# Patient Record
Sex: Male | Born: 1970 | Hispanic: No | Marital: Single | Smoking: Never smoker
Health system: Southern US, Community
[De-identification: ages and names within clinical notes are randomized; demographics above are authoritative.]

## PROBLEM LIST (undated history)

## (undated) DIAGNOSIS — N289 Disorder of kidney and ureter, unspecified: Secondary | ICD-10-CM

## (undated) DIAGNOSIS — R569 Unspecified convulsions: Secondary | ICD-10-CM

---

## 1999-04-01 ENCOUNTER — Ambulatory Visit (HOSPITAL_COMMUNITY): Admission: RE | Admit: 1999-04-01 | Discharge: 1999-04-01 | Payer: Self-pay | Admitting: Neurology

## 1999-04-16 ENCOUNTER — Ambulatory Visit (HOSPITAL_COMMUNITY): Admission: RE | Admit: 1999-04-16 | Discharge: 1999-04-16 | Payer: Self-pay | Admitting: Neurology

## 1999-05-21 ENCOUNTER — Ambulatory Visit (HOSPITAL_COMMUNITY): Admission: RE | Admit: 1999-05-21 | Discharge: 1999-05-21 | Payer: Self-pay | Admitting: Neurosurgery

## 1999-05-21 ENCOUNTER — Encounter: Payer: Self-pay | Admitting: Neurosurgery

## 1999-05-25 ENCOUNTER — Inpatient Hospital Stay (HOSPITAL_COMMUNITY): Admission: RE | Admit: 1999-05-25 | Discharge: 1999-05-27 | Payer: Self-pay | Admitting: Neurosurgery

## 2001-03-12 ENCOUNTER — Ambulatory Visit (HOSPITAL_COMMUNITY): Admission: RE | Admit: 2001-03-12 | Discharge: 2001-03-12 | Payer: Self-pay | Admitting: Neurology

## 2001-03-13 ENCOUNTER — Encounter: Payer: Self-pay | Admitting: Neurology

## 2002-03-07 ENCOUNTER — Emergency Department (HOSPITAL_COMMUNITY): Admission: EM | Admit: 2002-03-07 | Discharge: 2002-03-07 | Payer: Self-pay | Admitting: *Deleted

## 2002-03-14 ENCOUNTER — Encounter: Payer: Self-pay | Admitting: Orthopedic Surgery

## 2002-03-14 ENCOUNTER — Inpatient Hospital Stay (HOSPITAL_COMMUNITY): Admission: RE | Admit: 2002-03-14 | Discharge: 2002-03-16 | Payer: Self-pay | Admitting: Orthopedic Surgery

## 2010-10-17 ENCOUNTER — Encounter: Payer: Self-pay | Admitting: Family Medicine

## 2014-09-24 ENCOUNTER — Emergency Department (HOSPITAL_COMMUNITY)
Admission: EM | Admit: 2014-09-24 | Discharge: 2014-09-24 | Disposition: A | Discharge status: Home / Self Care | Payer: BC Managed Care – PPO | Attending: Emergency Medicine | Admitting: Emergency Medicine

## 2014-09-24 ENCOUNTER — Emergency Department (HOSPITAL_COMMUNITY): Payer: BC Managed Care – PPO

## 2014-09-24 ENCOUNTER — Encounter (HOSPITAL_COMMUNITY): Payer: Self-pay

## 2014-09-24 DIAGNOSIS — G40909 Epilepsy, unspecified, not intractable, without status epilepticus: Secondary | ICD-10-CM | POA: Insufficient documentation

## 2014-09-24 DIAGNOSIS — R109 Unspecified abdominal pain: Secondary | ICD-10-CM | POA: Diagnosis present

## 2014-09-24 DIAGNOSIS — Z79899 Other long term (current) drug therapy: Secondary | ICD-10-CM | POA: Diagnosis not present

## 2014-09-24 DIAGNOSIS — N201 Calculus of ureter: Secondary | ICD-10-CM | POA: Insufficient documentation

## 2014-09-24 HISTORY — DX: Unspecified convulsions: R56.9

## 2014-09-24 HISTORY — DX: Disorder of kidney and ureter, unspecified: N28.9

## 2014-09-24 LAB — CBC WITH DIFFERENTIAL/PLATELET
BASOS PCT: 0 % (ref 0–1)
Basophils Absolute: 0 10*3/uL (ref 0.0–0.1)
EOS PCT: 1 % (ref 0–5)
Eosinophils Absolute: 0.1 10*3/uL (ref 0.0–0.7)
HCT: 38.5 % — ABNORMAL LOW (ref 39.0–52.0)
HEMOGLOBIN: 13.1 g/dL (ref 13.0–17.0)
LYMPHS ABS: 1.2 10*3/uL (ref 0.7–4.0)
LYMPHS PCT: 13 % (ref 12–46)
MCH: 31.4 pg (ref 26.0–34.0)
MCHC: 34 g/dL (ref 30.0–36.0)
MCV: 92.3 fL (ref 78.0–100.0)
Monocytes Absolute: 0.7 10*3/uL (ref 0.1–1.0)
Monocytes Relative: 8 % (ref 3–12)
NEUTROS ABS: 7.3 10*3/uL (ref 1.7–7.7)
NEUTROS PCT: 78 % — AB (ref 43–77)
Platelets: 273 10*3/uL (ref 150–400)
RBC: 4.17 MIL/uL — AB (ref 4.22–5.81)
RDW: 11.8 % (ref 11.5–15.5)
WBC: 9.4 10*3/uL (ref 4.0–10.5)

## 2014-09-24 LAB — BASIC METABOLIC PANEL
Anion gap: 7 (ref 5–15)
BUN: 16 mg/dL (ref 6–23)
CHLORIDE: 102 meq/L (ref 96–112)
CO2: 26 mmol/L (ref 19–32)
Calcium: 9.1 mg/dL (ref 8.4–10.5)
Creatinine, Ser: 1.58 mg/dL — ABNORMAL HIGH (ref 0.50–1.35)
GFR calc Af Amer: 61 mL/min — ABNORMAL LOW (ref >90)
GFR calc non Af Amer: 52 mL/min — ABNORMAL LOW (ref >90)
Glucose, Bld: 104 mg/dL — ABNORMAL HIGH (ref 70–99)
Potassium: 4 mmol/L (ref 3.5–5.1)
Sodium: 135 mmol/L (ref 135–145)

## 2014-09-24 LAB — URINALYSIS, ROUTINE W REFLEX MICROSCOPIC
Bilirubin Urine: NEGATIVE
Glucose, UA: NEGATIVE mg/dL
Hgb urine dipstick: NEGATIVE
KETONES UR: NEGATIVE mg/dL
Leukocytes, UA: NEGATIVE
NITRITE: NEGATIVE
PH: 5.5 (ref 5.0–8.0)
Protein, ur: 30 mg/dL — AB
Specific Gravity, Urine: 1.024 (ref 1.005–1.030)
Urobilinogen, UA: 0.2 mg/dL (ref 0.0–1.0)

## 2014-09-24 LAB — URINE MICROSCOPIC-ADD ON

## 2014-09-24 MED ORDER — OXYCODONE-ACETAMINOPHEN 5-325 MG PO TABS: 1.0000 | ORAL_TABLET | ORAL | Status: AC | PRN

## 2014-09-24 MED ORDER — MORPHINE SULFATE 4 MG/ML IJ SOLN
4.0000 mg | Freq: Once | INTRAMUSCULAR | Status: AC
  Administered 2014-09-24: 4 mg via INTRAVENOUS
  Filled 2014-09-24: qty 1

## 2014-09-24 MED ORDER — ONDANSETRON HCL 4 MG/2ML IJ SOLN
4.0000 mg | Freq: Once | INTRAMUSCULAR | Status: AC
  Administered 2014-09-24: 4 mg via INTRAVENOUS
  Filled 2014-09-24: qty 2

## 2014-09-24 MED ORDER — SODIUM CHLORIDE 0.9 % IV BOLUS (SEPSIS)
1000.0000 mL | Freq: Once | INTRAVENOUS | Status: AC
  Administered 2014-09-24: 1000 mL via INTRAVENOUS

## 2014-09-24 NOTE — ED Provider Notes (Signed)
CSN: [id number]     Arrival date & time 09/24/14  [id number] History   First MD Initiated Contact with Patient 09/24/14 [id number]     Chief Complaint  Patient presents with  . Flank Pain     (Consider location/radiation/quality/duration/timing/severity/associated sxs/prior Treatment) HPI  43 year old male with right sided back pain for past 8 days. Saw PCP, given flomax, oxycodone and phenergan for presumptive kidney stone. Started high on right side, now is more low back and somewhat in groin. Feeling better a few days ago, yesterday required minimal pain meds. Pain is getting worse again. Initially had difficulty urinating, better with flomax. Feels similar to kidney stone from 10 years ago. Originally had hematuria, but none now. Vomited this morning, now feels nauseated.  Past Medical History  Diagnosis Date  . Renal disorder   . Seizures    History reviewed. No pertinent past surgical history. History reviewed. No pertinent family history. History  Substance Use Topics  . Smoking status: Never Smoker   . Smokeless tobacco: Not on file  . Alcohol Use: Yes    Review of Systems  Constitutional: Negative for fever.  Gastrointestinal: Positive for nausea and vomiting. Negative for abdominal pain.  Genitourinary: Positive for flank pain. Negative for dysuria, hematuria and testicular pain.  Musculoskeletal: Positive for back pain.  All other systems reviewed and are negative.     Allergies  Review of patient's allergies indicates no known allergies.  Home Medications   Prior to Admission medications   Medication Sig Start Date End Date Taking? Authorizing Provider  levETIRAcetam (KEPPRA) 1000 MG tablet Take 1,000 mg by mouth 2 (two) times daily.   Yes Historical Provider, MD  Multiple Vitamin (MULTIVITAMIN WITH MINERALS) TABS tablet Take 1 tablet by mouth daily.   Yes Historical Provider, MD  oxyCODONE-acetaminophen (PERCOCET/ROXICET) 5-325 MG per tablet Take 1 tablet by mouth  every 6 (six) hours as needed for severe pain.   Yes Historical Provider, MD  PRESCRIPTION MEDICATION once. Nausea and pain shot (unknown names) at Dr's office   Yes Historical Provider, MD  promethazine (PHENERGAN) 25 MG tablet Take 25 mg by mouth 3 (three) times daily as needed for nausea or vomiting.   Yes Historical Provider, MD  tamsulosin (FLOMAX) 0.4 MG CAPS capsule Take 0.4 mg by mouth daily.   Yes Historical Provider, MD   BP 176/91 mmHg  Pulse 81  Temp(Src) 97.6 F (36.4 C) (Oral)  Resp 20  Ht 5\' 11"  (1.803 m)  Wt 275 lb (124.739 kg)  BMI 38.37 kg/m2  SpO2 97% Physical Exam  Constitutional: He is oriented to person, place, and time. He appears well-developed and well-nourished.  Sitting comfortably, displays pain when moving positions  HENT:  Head: Normocephalic and atraumatic.  Right Ear: External ear normal.  Left Ear: External ear normal.  Nose: Nose normal.  Eyes: Right eye exhibits no discharge. Left eye exhibits no discharge.  Neck: Neck supple.  Cardiovascular: Normal rate, regular rhythm, normal heart sounds and intact distal pulses.   Pulmonary/Chest: Effort normal and breath sounds normal.  Abdominal: Soft. He exhibits no distension. There is no tenderness. There is no CVA tenderness.  Musculoskeletal: He exhibits no edema.  No focal thoracic or lumbar back tenderness  Neurological: He is alert and oriented to person, place, and time.  Skin: Skin is warm and dry.  Nursing note and vitals reviewed.   ED Course  Procedures (including critical care time) Labs Review Labs Reviewed  URINALYSIS, ROUTINE W REFLEX MICROSCOPIC -  Abnormal; Notable for the following:    Protein, ur 30 (*)    All other components within normal limits  BASIC METABOLIC PANEL - Abnormal; Notable for the following:    Glucose, Bld 104 (*)    Creatinine, Ser 1.58 (*)    GFR calc non Af Amer 52 (*)    GFR calc Af Amer 61 (*)    All other components within normal limits  CBC WITH  DIFFERENTIAL - Abnormal; Notable for the following:    RBC 4.17 (*)    HCT 38.5 (*)    Neutrophils Relative % 78 (*)    All other components within normal limits  URINE MICROSCOPIC-ADD ON    Imaging Review Ct Renal Stone Study  09/24/2014   CLINICAL DATA:  One week history of right flank pain ; microscopic hematuria  EXAM: CT ABDOMEN AND PELVIS WITHOUT CONTRAST  TECHNIQUE: Multidetector CT imaging of the abdomen and pelvis was performed following the standard protocol without oral or intravenous contrast material administration.  COMPARISON:  May 13, 2004  FINDINGS: There is mild atelectasis in the posterior right base. Lung bases are otherwise clear.  Liver is prominent, measuring 19.8 cm in length. No focal liver lesions are identified on this noncontrast enhanced study. Gallbladder wall is not appreciably thickened. There is no biliary duct dilatation.  Spleen, pancreas, and adrenals appear normal.  There is a 3 mm nonobstructing calculus in the posterior mid left kidney. There is no left renal hydronephrosis or mass. There is no left-sided ureteral calculus.  Right kidney is mildly edematous with moderate hydronephrosis. There is no right renal mass. There is a 4 mm calculus in the distal right ureter. No other ureteral calculi are identified.  In the pelvis, the urinary bladder is midline with normal wall thickness. There are calcifications in the right side of the prostate. There is no pelvic mass or fluid collection. Appendix appears normal.  There is no bowel obstruction.  No free air or portal venous air.  There is no ascites, adenopathy, or abscess in the abdomen or pelvis. There is no abdominal aortic aneurysm. There is degenerative change in the lumbar spine. There are no blastic or lytic bone lesions.  IMPRESSION: 4 mm calculus distal right ureter with moderate hydronephrosis on the right.  3 mm nonobstructing calculus posterior mid left kidney.  Prominent liver without focal lesion.   Appendix appears normal.  No bowel obstruction.  No abscess.   Electronically Signed   By: Bretta BangWilliam  Woodruff M.D.   On: 09/24/2014 08:25     EKG Interpretation None      MDM   Final diagnoses:  Right ureteral stone    Patient has a distal right 4 mm ureteral calculus. His symptoms seem to be progressing and I do not feel that he has failed outpatient therapy at this time. He is already on Flomax and his been trying to keep up fluids. He is running out of pain medicine, will give short course of Percocet and give urology follow-up.    Audree CamelScott T Happy [place], MD 09/24/14 [phone]

## 2014-09-24 NOTE — ED Notes (Signed)
Pt complains of right flank pain for one week, pain got worse last night

## 2014-09-24 NOTE — ED Notes (Signed)
Patient transported to CT 

## 2014-09-24 NOTE — Discharge Instructions (Signed)

## 2015-12-20 IMAGING — CT CT RENAL STONE PROTOCOL
1 series · 14 of 21 positions shown, 19 images · non-contrast
Comparison: May 13, 2004

CLINICAL DATA: One week history of right flank pain ; microscopic
hematuria

EXAM:
CT ABDOMEN AND PELVIS WITHOUT CONTRAST
TECHNIQUE: Multidetector CT imaging of the abdomen and pelvis was performed
following the standard protocol without oral or intravenous contrast
material administration.

[Series 4: lung · axial · 0.79mm/px · z∈[+1676,+1766]mm · 14 of 21 slices shown, 19 images]
[im 2/21  soft-tissue]
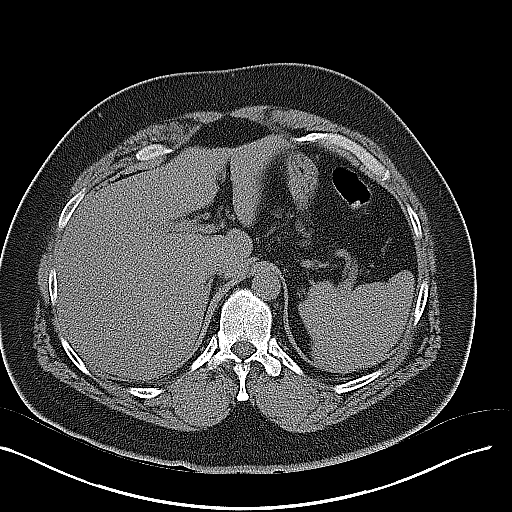
[im 2/21  bone]
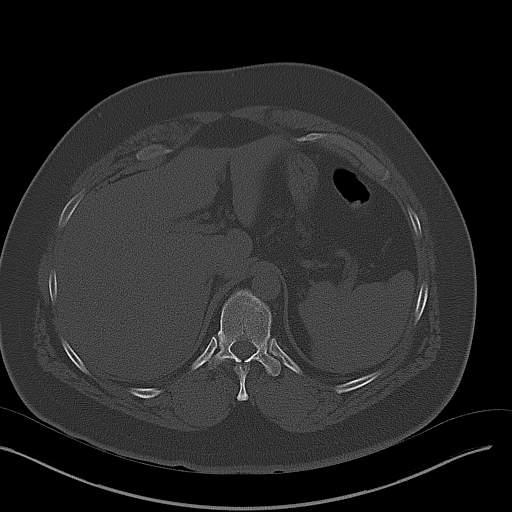
[im 4/21  soft-tissue]
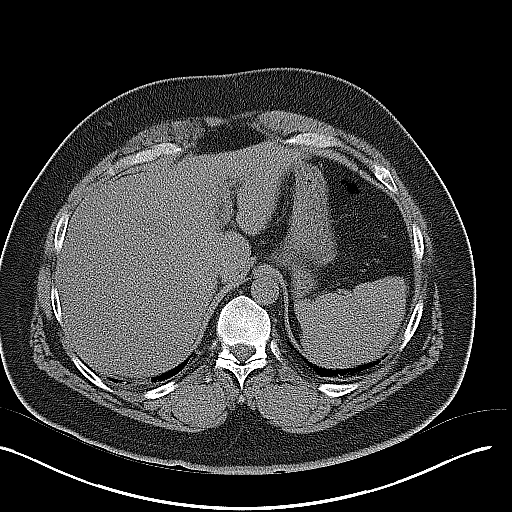
[im 5/21  soft-tissue]
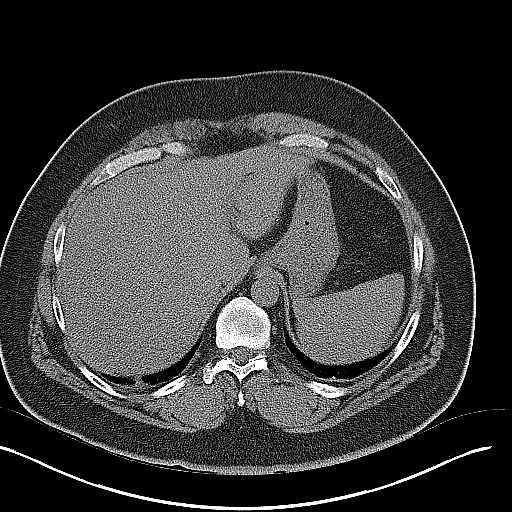
[im 7/21  soft-tissue]
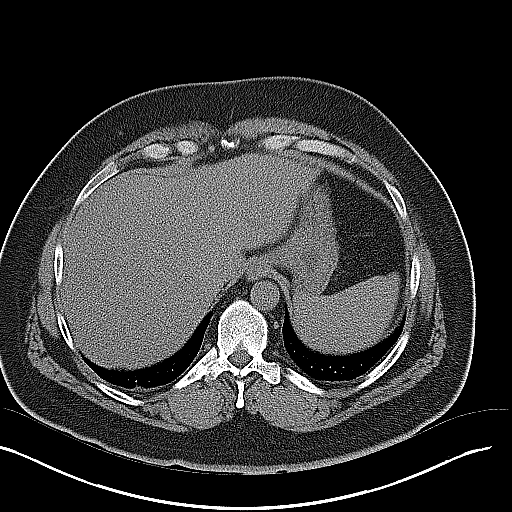
[im 8/21  soft-tissue]
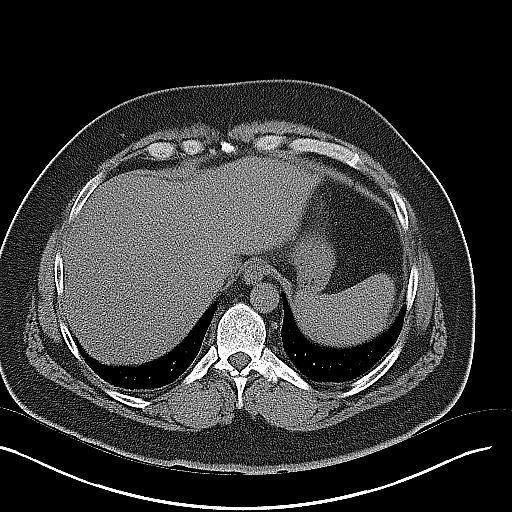
[im 10/21  soft-tissue]
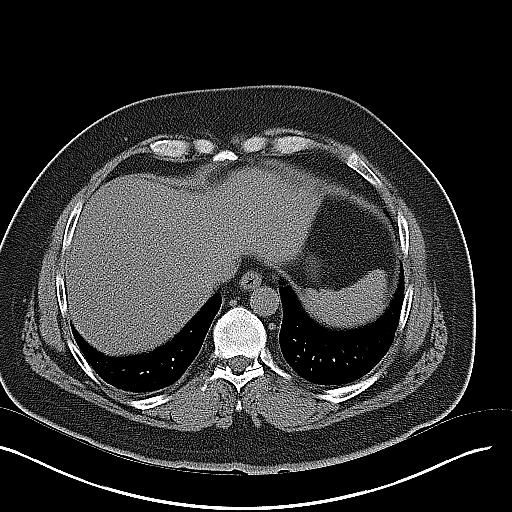
[im 11/21  soft-tissue]
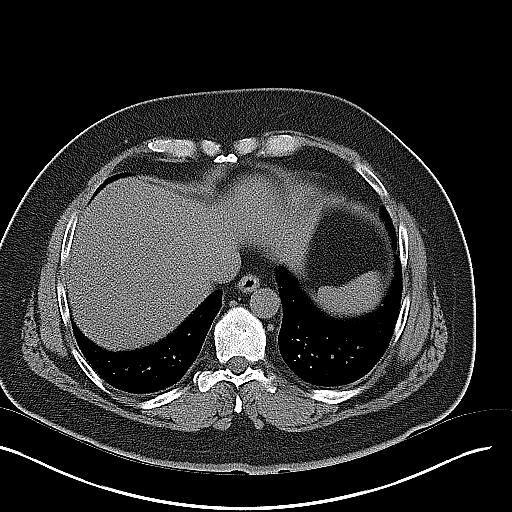
[im 12/21  soft-tissue]
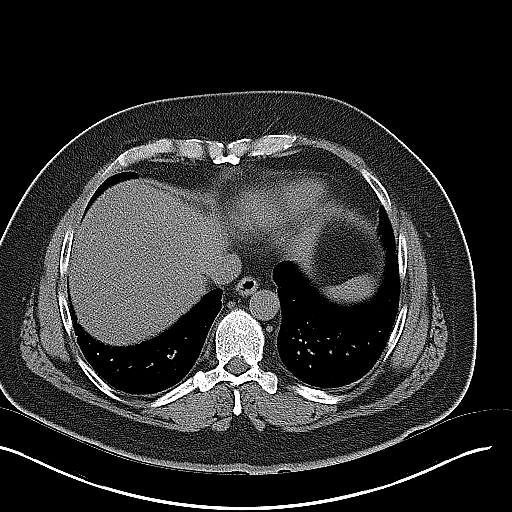
[im 14/21  soft-tissue]
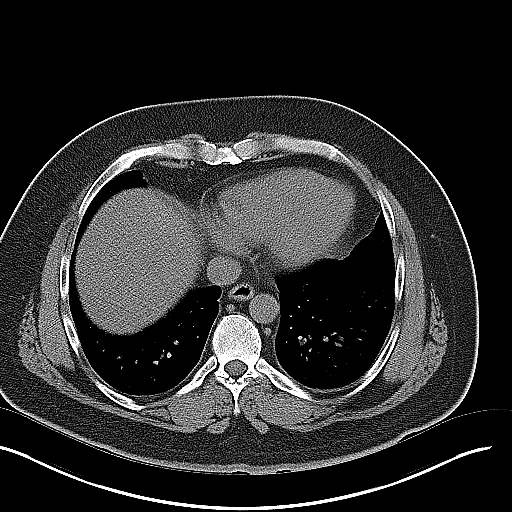
[im 14/21  bone]
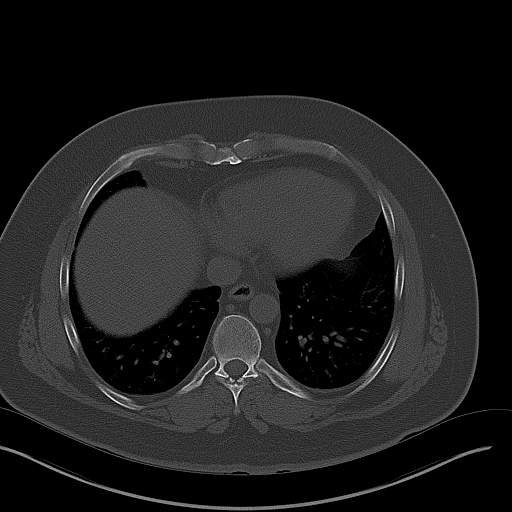
[im 15/21  soft-tissue]
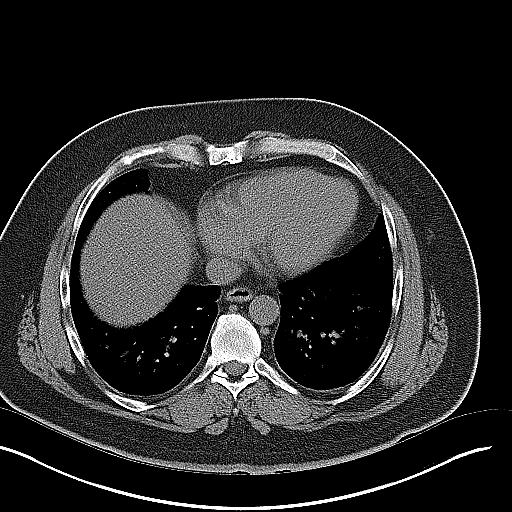
[im 17/21  soft-tissue]
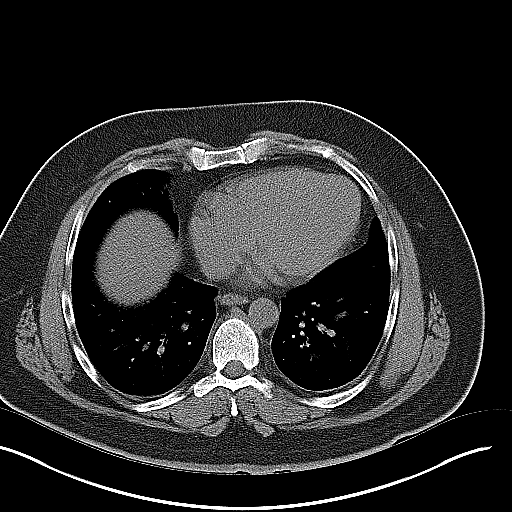
[im 17/21  lung]
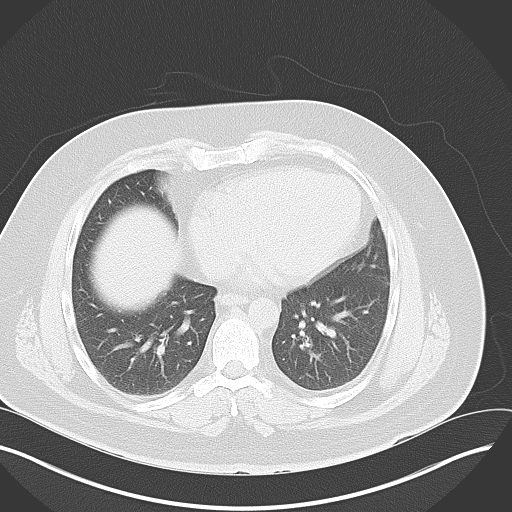
[im 18/21  soft-tissue]
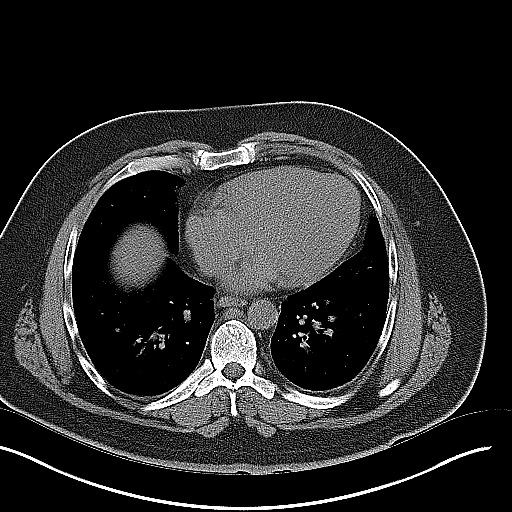
[im 18/21  lung]
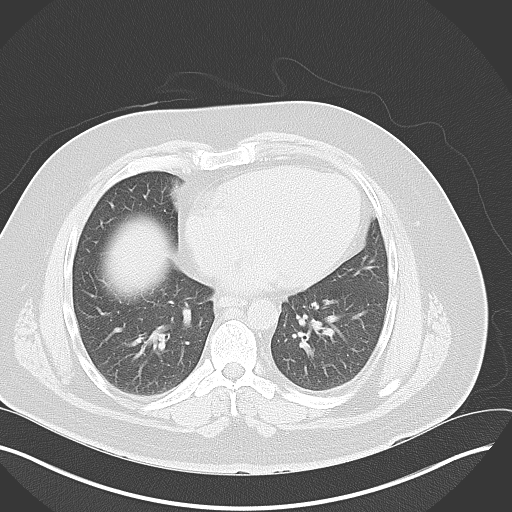
[im 19/21  lung]
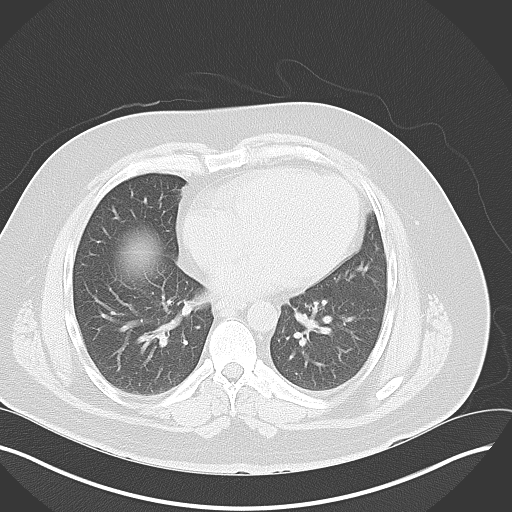
[im 20/21  soft-tissue]
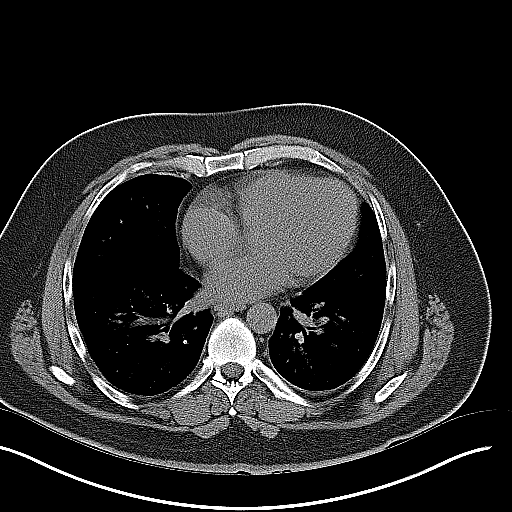
[im 20/21  lung]
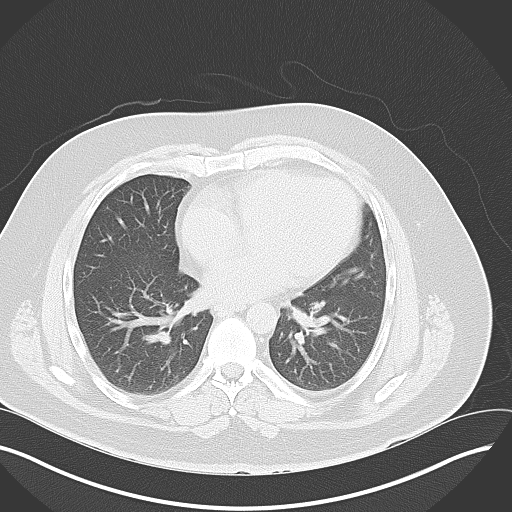

[14 of 21 positions shown; findings below may reference images not displayed]

FINDINGS: There is mild atelectasis in the posterior right base. Lung bases
are otherwise clear.

Liver is prominent, measuring 19.8 cm in length. No focal liver
lesions are identified on this noncontrast enhanced study.
Gallbladder wall is not appreciably thickened. There is no biliary
duct dilatation.

Spleen, pancreas, and adrenals appear normal.

There is a 3 mm nonobstructing calculus in the posterior mid left
kidney. There is no left renal hydronephrosis or mass. There is no
left-sided ureteral calculus.

Right kidney is mildly edematous with moderate hydronephrosis. There
is no right renal mass. There is a 4 mm calculus in the distal right
ureter. No other ureteral calculi are identified.

In the pelvis, the urinary bladder is midline with normal wall
thickness. There are calcifications in the right side of the
prostate. There is no pelvic mass or fluid collection. Appendix
appears normal.

There is no bowel obstruction.  No free air or portal venous air.

There is no ascites, adenopathy, or abscess in the abdomen or
pelvis. There is no abdominal aortic aneurysm. There is degenerative
change in the lumbar spine. There are no blastic or lytic bone
lesions.
IMPRESSION: 4 mm calculus distal right ureter with moderate hydronephrosis on
the right.

3 mm nonobstructing calculus posterior mid left kidney.

Prominent liver without focal lesion.

Appendix appears normal.  No bowel obstruction.  No abscess.

## 2017-05-17 DIAGNOSIS — S90862S Insect bite (nonvenomous), left foot, sequela: Secondary | ICD-10-CM | POA: Diagnosis not present

## 2017-07-03 DIAGNOSIS — R7303 Prediabetes: Secondary | ICD-10-CM | POA: Diagnosis not present

## 2017-07-03 DIAGNOSIS — R569 Unspecified convulsions: Secondary | ICD-10-CM | POA: Diagnosis not present

## 2017-07-03 DIAGNOSIS — Z0000 Encounter for general adult medical examination without abnormal findings: Secondary | ICD-10-CM | POA: Diagnosis not present

## 2018-01-01 DIAGNOSIS — R7303 Prediabetes: Secondary | ICD-10-CM | POA: Diagnosis not present

## 2018-07-10 DIAGNOSIS — Z0000 Encounter for general adult medical examination without abnormal findings: Secondary | ICD-10-CM | POA: Diagnosis not present

## 2018-07-10 DIAGNOSIS — R7301 Impaired fasting glucose: Secondary | ICD-10-CM | POA: Diagnosis not present

## 2018-07-10 DIAGNOSIS — Z136 Encounter for screening for cardiovascular disorders: Secondary | ICD-10-CM | POA: Diagnosis not present

## 2018-08-27 DIAGNOSIS — H53001 Unspecified amblyopia, right eye: Secondary | ICD-10-CM | POA: Diagnosis not present

## 2018-08-27 DIAGNOSIS — H10413 Chronic giant papillary conjunctivitis, bilateral: Secondary | ICD-10-CM | POA: Diagnosis not present

## 2018-08-27 DIAGNOSIS — H04123 Dry eye syndrome of bilateral lacrimal glands: Secondary | ICD-10-CM | POA: Diagnosis not present

## 2019-01-16 DIAGNOSIS — R7301 Impaired fasting glucose: Secondary | ICD-10-CM | POA: Diagnosis not present

## 2019-11-18 ENCOUNTER — Ambulatory Visit: Payer: Self-pay | Attending: Family

## 2019-11-18 DIAGNOSIS — Z23 Encounter for immunization: Secondary | ICD-10-CM | POA: Insufficient documentation

## 2019-11-18 NOTE — Progress Notes (Signed)
   Covid-19 Vaccination [facility]  Name:  Leighton Brickley    MRN: [id number] DOB: 06-29-1971  11/18/2019  Mr. Lampley was observed post Covid-19 immunization for 15 minutes without incidence. He was provided with Vaccine Information Sheet and instruction to access the V-Safe system.   Mr. Norem was instructed to call 911 with any severe reactions post vaccine: Marland Kitchen Difficulty breathing  . Swelling of your face and throat  . A fast heartbeat  . A bad rash all over your body  . Dizziness and weakness    Immunizations Administered    Name Date Dose VIS Date Route   Moderna COVID-19 Vaccine 11/18/2019  4:19 PM 0.5 mL 08/27/2019 Intramuscular   Manufacturer: Moderna   Lot: [id number]   NDC: 01720-910-68

## 2019-12-24 ENCOUNTER — Ambulatory Visit: Payer: Self-pay

## 2019-12-31 ENCOUNTER — Ambulatory Visit: Payer: Self-pay | Attending: Family

## 2019-12-31 ENCOUNTER — Ambulatory Visit: Payer: Self-pay

## 2019-12-31 DIAGNOSIS — Z23 Encounter for immunization: Secondary | ICD-10-CM

## 2019-12-31 NOTE — Progress Notes (Signed)
   Covid-19 Vaccination Clinic  Name:  Zachary Pena    MRN: [id number] DOB: 1970/12/27  12/31/2019  Mr. Zachary Pena was observed post Covid-19 immunization for 15 minutes without incident. He was provided with Vaccine Information Sheet and instruction to access the V-Safe system.   Mr. Zachary Pena was instructed to call 911 with any severe reactions post vaccine: Marland Kitchen Difficulty breathing  . Swelling of face and throat  . A fast heartbeat  . A bad rash all over body  . Dizziness and weakness   Immunizations Administered    Name Date Dose VIS Date Route   Moderna COVID-19 Vaccine 12/31/2019  4:14 PM 0.5 mL 08/27/2019 Intramuscular   Manufacturer: Moderna   Lot: [id number]   NDC: 36629-476-54

## 2022-08-22 DIAGNOSIS — R569 Unspecified convulsions: Secondary | ICD-10-CM | POA: Diagnosis not present

## 2022-08-22 DIAGNOSIS — Z113 Encounter for screening for infections with a predominantly sexual mode of transmission: Secondary | ICD-10-CM | POA: Diagnosis not present

## 2022-08-22 DIAGNOSIS — Z13220 Encounter for screening for lipoid disorders: Secondary | ICD-10-CM | POA: Diagnosis not present

## 2022-08-22 DIAGNOSIS — R7303 Prediabetes: Secondary | ICD-10-CM | POA: Diagnosis not present

## 2022-08-22 DIAGNOSIS — Z0000 Encounter for general adult medical examination without abnormal findings: Secondary | ICD-10-CM | POA: Diagnosis not present

## 2023-02-21 DIAGNOSIS — Z1211 Encounter for screening for malignant neoplasm of colon: Secondary | ICD-10-CM | POA: Diagnosis not present

## 2023-02-21 DIAGNOSIS — R7303 Prediabetes: Secondary | ICD-10-CM | POA: Diagnosis not present

## 2023-08-23 DIAGNOSIS — Z125 Encounter for screening for malignant neoplasm of prostate: Secondary | ICD-10-CM | POA: Diagnosis not present

## 2023-08-23 DIAGNOSIS — Z13220 Encounter for screening for lipoid disorders: Secondary | ICD-10-CM | POA: Diagnosis not present

## 2023-08-23 DIAGNOSIS — R569 Unspecified convulsions: Secondary | ICD-10-CM | POA: Diagnosis not present

## 2023-08-23 DIAGNOSIS — Z113 Encounter for screening for infections with a predominantly sexual mode of transmission: Secondary | ICD-10-CM | POA: Diagnosis not present

## 2023-08-23 DIAGNOSIS — Z0000 Encounter for general adult medical examination without abnormal findings: Secondary | ICD-10-CM | POA: Diagnosis not present

## 2023-08-23 DIAGNOSIS — E119 Type 2 diabetes mellitus without complications: Secondary | ICD-10-CM | POA: Diagnosis not present

## 2023-10-06 DIAGNOSIS — Z6841 Body Mass Index (BMI) 40.0 and over, adult: Secondary | ICD-10-CM | POA: Diagnosis not present

## 2023-10-06 DIAGNOSIS — E119 Type 2 diabetes mellitus without complications: Secondary | ICD-10-CM | POA: Diagnosis not present

## 2024-02-20 DIAGNOSIS — E119 Type 2 diabetes mellitus without complications: Secondary | ICD-10-CM | POA: Diagnosis not present

## 2024-09-09 DIAGNOSIS — R569 Unspecified convulsions: Secondary | ICD-10-CM | POA: Diagnosis not present

## 2024-09-09 DIAGNOSIS — E119 Type 2 diabetes mellitus without complications: Secondary | ICD-10-CM | POA: Diagnosis not present

## 2024-09-09 DIAGNOSIS — Z0000 Encounter for general adult medical examination without abnormal findings: Secondary | ICD-10-CM | POA: Diagnosis not present
